# Patient Record
Sex: Male | Born: 1985 | Hispanic: Yes | Marital: Married | Smoking: Never smoker
Health system: Southern US, Community
[De-identification: ages and names within clinical notes are randomized; demographics above are authoritative.]

---

## 2014-11-14 ENCOUNTER — Emergency Department
Admission: EM | Admit: 2014-11-14 | Discharge: 2014-11-15 | Disposition: A | Discharge status: Home / Self Care | Payer: Self-pay | Attending: Emergency Medicine | Admitting: Emergency Medicine

## 2014-11-14 ENCOUNTER — Emergency Department: Payer: Self-pay

## 2014-11-14 ENCOUNTER — Encounter: Payer: Self-pay | Admitting: *Deleted

## 2014-11-14 DIAGNOSIS — N453 Epididymo-orchitis: Secondary | ICD-10-CM | POA: Insufficient documentation

## 2014-11-14 DIAGNOSIS — N50819 Testicular pain, unspecified: Secondary | ICD-10-CM

## 2014-11-14 DIAGNOSIS — R52 Pain, unspecified: Secondary | ICD-10-CM

## 2014-11-14 LAB — URINALYSIS COMPLETE WITH MICROSCOPIC (ARMC ONLY)
BILIRUBIN URINE: NEGATIVE
Bacteria, UA: NONE SEEN
Glucose, UA: NEGATIVE mg/dL
HGB URINE DIPSTICK: NEGATIVE
KETONES UR: NEGATIVE mg/dL
Nitrite: NEGATIVE
PH: 7 (ref 5.0–8.0)
PROTEIN: NEGATIVE mg/dL
Specific Gravity, Urine: 1.018 (ref 1.005–1.030)

## 2014-11-14 MED ORDER — HYDROMORPHONE HCL 1 MG/ML IJ SOLN
0.5000 mg | Freq: Once | INTRAMUSCULAR | Status: AC
  Administered 2014-11-14: 0.5 mg via INTRAMUSCULAR
  Filled 2014-11-14: qty 1

## 2014-11-14 MED ORDER — CEFTRIAXONE SODIUM 250 MG IJ SOLR
250.0000 mg | Freq: Once | INTRAMUSCULAR | Status: AC
  Administered 2014-11-14: 250 mg via INTRAMUSCULAR
  Filled 2014-11-14: qty 250

## 2014-11-14 MED ORDER — DOXYCYCLINE HYCLATE 100 MG PO TABS
100.0000 mg | ORAL_TABLET | Freq: Once | ORAL | Status: AC
  Administered 2014-11-14: 100 mg via ORAL
  Filled 2014-11-14: qty 1

## 2014-11-14 MED ORDER — ONDANSETRON 4 MG PO TBDP
4.0000 mg | ORAL_TABLET | Freq: Once | ORAL | Status: AC
  Administered 2014-11-14: 4 mg via ORAL
  Filled 2014-11-14: qty 1

## 2014-11-14 NOTE — ED Provider Notes (Signed)
[facility] Emergency Department Provider Note  ____________________________________________  Time seen: Approximately 10:56 PM  I have reviewed the triage vital signs and the nursing notes.   HISTORY  Chief Complaint Testicle Pain    HPI Kent Lozano is a 29 y.o. male who complains of right testicular pain and swelling for a week it's getting worse. Patient denies any discharge. Patient says nothing is tries really help very much. The pain is severe and achy in nature.   History reviewed. No pertinent past medical history.  There are no active problems to display for this patient.   History reviewed. No pertinent past surgical history.  Current Outpatient Rx  Name  Route  Sig  Dispense  Refill  . ibuprofen (ADVIL,MOTRIN) 200 MG tablet   Oral   Take 200 mg by mouth every 6 (six) hours as needed.         . doxycycline (VIBRAMYCIN) 50 MG capsule      doxycycline 100 mg 1 po bid Disp 20   20 capsule   0   . oxyCODONE-acetaminophen (PERCOCET) 10-325 MG per tablet   Oral   Take 1 tablet by mouth every 6 (six) hours as needed for pain.   20 tablet   0     Allergies Review of patient's allergies indicates no known allergies.  No family history on file.  Social History Social History  Substance Use Topics  . Smoking status: Never Smoker   . Smokeless tobacco: None  . Alcohol Use: No    Review of Systems Constitutional: No fever/chills Eyes: No visual changes. ENT: No sore throat. Cardiovascular: Denies chest pain. Respiratory: Denies shortness of breath. Gastrointestinal: No abdominal pain.  No nausea, no vomiting.  No diarrhea.  No constipation. Genitourinary: Negative for dysuria. Musculoskeletal: Negative for back pain. Skin: Negative for rash. Neurological: Negative for headaches, focal weakness or numbness.  10-point ROS otherwise negative.  ____________________________________________   PHYSICAL EXAM:  VITAL  SIGNS: ED Triage Vitals  Enc Vitals Group     BP 11/14/14 2134 113/80 mmHg     Pulse Rate 11/14/14 2134 72     Resp 11/14/14 2134 18     Temp 11/14/14 2134 99.9 F (37.7 C)     Temp Source 11/14/14 2134 Oral     SpO2 11/14/14 2134 99 %     Weight 11/14/14 2134 140 lb (63.504 kg)     Height --      Head Cir --      Peak Flow --      Pain Score 11/14/14 2135 10     Pain Loc --      Pain Edu? --      Excl. in GC? --     Constitutional: Alert and oriented. Well appearing and in distress. Eyes: Conjunctivae are normal. PERRL. EOMI. Head: Atraumatic. Nose: No congestion/rhinnorhea. Mouth/Throat: Mucous membranes are moist.  Oropharynx non-erythematous. Neck: No stridor. Cardiovascular: Normal rate, regular rhythm. Grossly normal heart sounds.  Good peripheral circulation. Respiratory: Normal respiratory effort.  No retractions. Lungs CTAB. Gastrointestinal: Soft and nontender. No distention. No abdominal bruits. No CVA tenderness. Genitourinary: Normal male genitalia there is no discharge from the penis. Right testicle is very red swollen and tender. I'm unable to distinguish the epididymis from the rest of testicle. Musculoskeletal: No lower extremity tenderness nor edema.  No joint effusions. Neurologic:  Normal speech and language. No gross focal neurologic deficits are appreciated. No gait instability. Skin:  Skin is warm, dry and  intact. No rash noted. Psychiatric: Mood and affect are normal. Speech and behavior are normal.  ____________________________________________   LABS (all labs ordered are listed, but only abnormal results are displayed)  Labs Reviewed  URINALYSIS COMPLETEWITH MICROSCOPIC ([facility] ONLY) - Abnormal; Notable for the following:    Color, Urine YELLOW (*)    APPearance CLEAR (*)    Leukocytes, UA 2+ (*)    Squamous Epithelial / LPF 0-5 (*)    All other components within normal limits  CHLAMYDIA/NGC RT PCR ([facility] ONLY)    ____________________________________________  EKG   ____________________________________________  RADIOLOGY  No evidence of torsion. Appears to be epididymoorchitis. Ultrasound results discussed with radiology L urologist. He feels that the septations in the hydrocele are okay and would just treat with antibiotics. ____________________________________________   PROCEDURES    ____________________________________________   INITIAL IMPRESSION / ASSESSMENT AND PLAN / ED COURSE  Pertinent labs & imaging results that were available during my care of the patient were reviewed by me and considered in my medical decision making (see chart for details).   ____________________________________________   FINAL CLINICAL IMPRESSION(S) / ED DIAGNOSES  Final diagnoses:  Epididymoorchitis      Arnaldo Natal, MD 11/15/14 [phone]

## 2014-11-14 NOTE — ED Notes (Signed)
Pt ambulatory to ER room 1 from U/S with U/S tech. Pt AAOx4. NAD noted. RR even and nonlabored. Pt c/o right testicle pain x 1 week. Denies abd pain or urinary symptoms. Warm blanket provided to patient. Will continue to monitor.

## 2014-11-14 NOTE — ED Notes (Signed)
Pt states right testicular swelling x 1 week with increased pain. Denies urinary symptoms

## 2014-11-15 LAB — CHLAMYDIA/NGC RT PCR (ARMC ONLY)
CHLAMYDIA TR: NOT DETECTED
N gonorrhoeae: NOT DETECTED

## 2014-11-15 MED ORDER — DOXYCYCLINE HYCLATE 50 MG PO CAPS: ORAL_CAPSULE | ORAL | Status: DC

## 2014-11-15 MED ORDER — OXYCODONE-ACETAMINOPHEN 10-325 MG PO TABS: 1.0000 | ORAL_TABLET | Freq: Four times a day (QID) | ORAL | Status: AC | PRN

## 2018-01-13 ENCOUNTER — Other Ambulatory Visit: Payer: Self-pay

## 2018-01-13 ENCOUNTER — Emergency Department
Admission: EM | Admit: 2018-01-13 | Discharge: 2018-01-13 | Disposition: A | Discharge status: Home / Self Care | Payer: Self-pay | Attending: Emergency Medicine | Admitting: Emergency Medicine

## 2018-01-13 ENCOUNTER — Encounter: Payer: Self-pay | Admitting: Emergency Medicine

## 2018-01-13 ENCOUNTER — Emergency Department: Payer: Self-pay

## 2018-01-13 DIAGNOSIS — W268XXA Contact with other sharp object(s), not elsewhere classified, initial encounter: Secondary | ICD-10-CM | POA: Insufficient documentation

## 2018-01-13 DIAGNOSIS — Y9289 Other specified places as the place of occurrence of the external cause: Secondary | ICD-10-CM | POA: Insufficient documentation

## 2018-01-13 DIAGNOSIS — Y999 Unspecified external cause status: Secondary | ICD-10-CM | POA: Insufficient documentation

## 2018-01-13 DIAGNOSIS — S61511A Laceration without foreign body of right wrist, initial encounter: Secondary | ICD-10-CM | POA: Insufficient documentation

## 2018-01-13 DIAGNOSIS — Y9389 Activity, other specified: Secondary | ICD-10-CM | POA: Insufficient documentation

## 2018-01-13 MED ORDER — LIDOCAINE HCL (PF) 1 % IJ SOLN
5.0000 mL | Freq: Once | INTRAMUSCULAR | Status: AC
  Administered 2018-01-13: 5 mL
  Filled 2018-01-13: qty 5

## 2018-01-13 MED ORDER — BACITRACIN-NEOMYCIN-POLYMYXIN 400-5-5000 EX OINT
TOPICAL_OINTMENT | Freq: Once | CUTANEOUS | Status: AC
  Administered 2018-01-13: 22:00:00 via TOPICAL
  Filled 2018-01-13: qty 1

## 2018-01-13 NOTE — ED Triage Notes (Addendum)
Patient ambulatory to triage with steady gait, without difficulty or distress noted; pt reports was cutting a duct and fell cutting right wrist with piece of metal; denies need to file workers comp; no active bleeding; gauze dressing applied

## 2018-01-13 NOTE — ED Provider Notes (Signed)
[facility] Emergency Department Provider Note  ____________________________________________   First MD Initiated Contact with Patient 01/13/18 2044     (approximate)  I have reviewed the triage vital signs and the nursing notes.   HISTORY  Chief Complaint Laceration    HPI Kent Lozano is a 32 y.o. male presents emergency department complaining of right wrist pain.  He states he was cutting air duct and fell cutting his right wrist.  He denies any numbness or tingling.  He states he has full motion of his hand.  He is tetanus is up-to-date.  He is afraid that the metal hit the bone.   History reviewed. No pertinent past medical history.  There are no active problems to display for this patient.   History reviewed. No pertinent surgical history.  Prior to Admission medications   Not on File    Allergies Patient has no known allergies.  No family history on file.  Social History Social History   Tobacco Use  . Smoking status: Never Smoker  . Smokeless tobacco: Never Used  Substance Use Topics  . Alcohol use: No  . Drug use: Not on file    Review of Systems  Constitutional: No fever/chills Eyes: No visual changes. ENT: No sore throat. Respiratory: Denies cough Genitourinary: Negative for dysuria. Musculoskeletal: Negative for back pain.  Positive for laceration to the right wrist and wrist pain. Skin: Negative for rash.    ____________________________________________   PHYSICAL EXAM:  VITAL SIGNS: ED Triage Vitals [01/13/18 2034]  Enc Vitals Group     BP 118/75     Pulse Rate 63     Resp 18     Temp 98.3 F (36.8 C)     Temp Source Oral     SpO2 99 %     Weight 135 lb (61.2 kg)     Height 5\' 6"  (1.676 m)     Head Circumference      Peak Flow      Pain Score 9     Pain Loc      Pain Edu?      Excl. in GC?     Constitutional: Alert and oriented. Well appearing and in no acute distress. Eyes: Conjunctivae are  normal.  Head: Atraumatic. Nose: No congestion/rhinnorhea. Mouth/Throat: Mucous membranes are moist.   Neck:  supple no lymphadenopathy noted Cardiovascular: Normal rate, regular rhythm.  Respiratory: Normal respiratory effort.  No retractions, l GU: deferred Musculoskeletal: FROM all extremities, warm and well perfused.  Full range of motion of all fingers.  Patient is able to make a fist without difficulty.  Laceration along the ulnar aspect of the dorsum of the right wrist.  No tendon laceration is noted.  No foreign body is noted. Neurologic:  Normal speech and language.  Skin:  Skin is warm, dry . No rash noted.  Positive laceration to the right wrist Psychiatric: Mood and affect are normal. Speech and behavior are normal.  ____________________________________________   LABS (all labs ordered are listed, but only abnormal results are displayed)  Labs Reviewed - No data to display ____________________________________________   ____________________________________________  RADIOLOGY  X-ray of the right wrist is negative for fracture or foreign body.  ____________________________________________   PROCEDURES  Procedure(s) performed: No  ..Laceration Repair Date/Time: 01/13/2018 10:11 PM Performed by: Faythe Ghee, PA-C Authorized by: Faythe Ghee, PA-C   Consent:    Consent obtained:  Verbal   Consent given by:  Patient   Risks  discussed:  Infection, pain, retained foreign body, tendon damage, poor wound healing and nerve damage   Alternatives discussed:  Delayed treatment Anesthesia (see MAR for exact dosages):    Anesthesia method:  Local infiltration   Local anesthetic:  Lidocaine 1% w/o epi Laceration details:    Location:  Shoulder/arm   Shoulder/arm location:  R lower arm   Length (cm):  2   Depth (mm):  3 Repair type:    Repair type:  Simple Pre-procedure details:    Preparation:  Patient was prepped and draped in usual sterile  fashion Exploration:    Hemostasis achieved with:  Direct pressure   Wound exploration: wound explored through full range of motion     Wound extent: no foreign bodies/material noted, no nerve damage noted, no tendon damage noted and no underlying fracture noted   Treatment:    Area cleansed with:  Betadine and saline   Amount of cleaning:  Standard   Irrigation solution:  Sterile saline   Irrigation method:  Syringe and tap Skin repair:    Repair method:  Sutures   Suture size:  5-0   Suture material:  Nylon   Suture technique:  Simple interrupted   Number of sutures:  7 Approximation:    Approximation:  Close Post-procedure details:    Dressing:  Antibiotic ointment, non-adherent dressing and splint for protection   Patient tolerance of procedure:  Tolerated well, no immediate complications      ____________________________________________   INITIAL IMPRESSION / ASSESSMENT AND PLAN / ED COURSE  Pertinent labs & imaging results that were available during my care of the patient were reviewed by me and considered in my medical decision making (see chart for details).   Patient is 32 year old male presents emergency department laceration to the right wrist.  Tetanus is up-to-date.  On physical exam patient appears well.  There is a 2 cm laceration to the right wrist.  X-ray of the right wrist is negative.  The area was repaired with 7 simple sutures.  See procedure note.  The patient was given instructions on how to care for the sutures.  Sutures should be removed in 7 to 10 days.  Take Tylenol or ibuprofen for pain as needed.  Wear the splint especially while at work to not rip out the sutures.  The patient states he understands will comply.  He was given a work note for tomorrow if needed.  He was discharged in stable condition in the care of his family.     As part of my medical decision making, I reviewed the following data within the electronic MEDICAL RECORD NUMBER Nursing  notes reviewed and incorporated, Old chart reviewed, Radiograph reviewed x-ray of the right wrist is negative for fracture, Notes from prior ED visits and [place] Controlled Substance Database  ____________________________________________   FINAL CLINICAL IMPRESSION(S) / ED DIAGNOSES  Final diagnoses:  Wrist laceration, right, initial encounter      NEW MEDICATIONS STARTED DURING THIS VISIT:  Discharge Medication List as of 01/13/2018 10:02 PM       Note:  This document was prepared using Dragon voice recognition software and may include unintentional dictation errors.    Faythe Ghee, PA-C 01/13/18 2213    Dionne Bucy, MD 01/14/18 [phone]

## 2018-01-13 NOTE — Discharge Instructions (Addendum)
Clean the area with soap and water daily.  When you are at home and the area is not draining please allow the air to get to the wound so it will heal easier.  Wear the wrist splint when it works you do not refound stitches.  Return to the emergency department there is any sign of infection.  Sutures should be removed in 7 to 10 days.

## 2020-02-01 IMAGING — DX DG WRIST COMPLETE 3+V*R*
4 series · 4 of 4 positions shown · non-contrast
Comparison: None.

CLINICAL DATA: Pain and injury

EXAM:
RIGHT WRIST - COMPLETE 3+ VIEW

[wrist ap (1 of 2)]
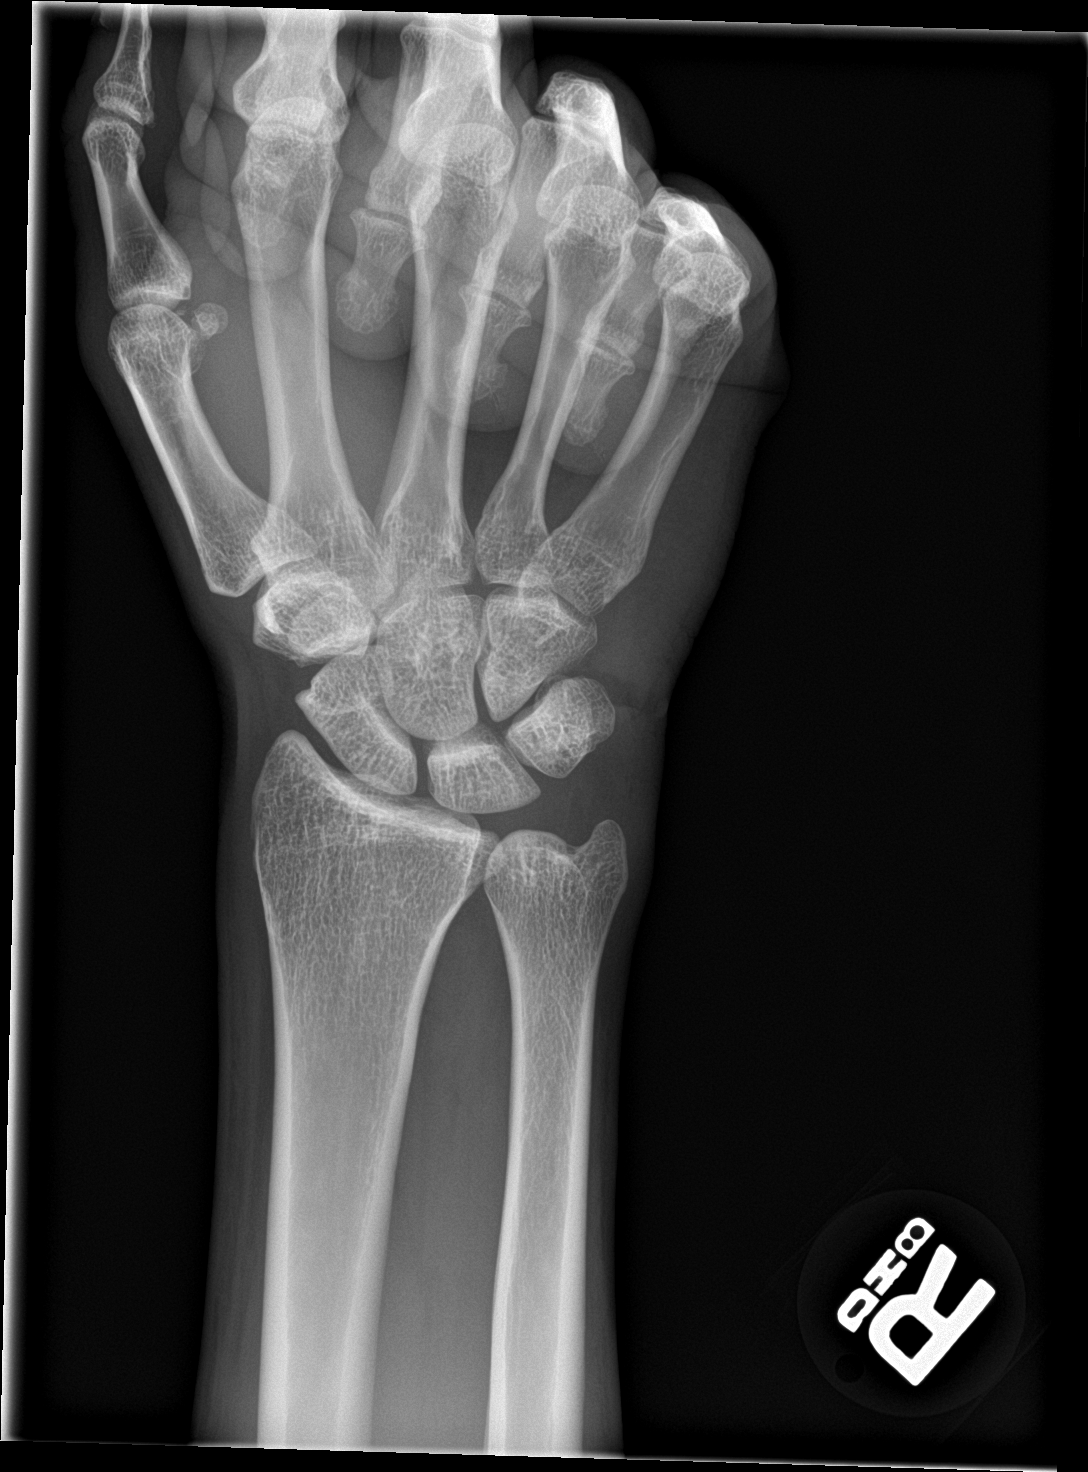

[wrist obl]
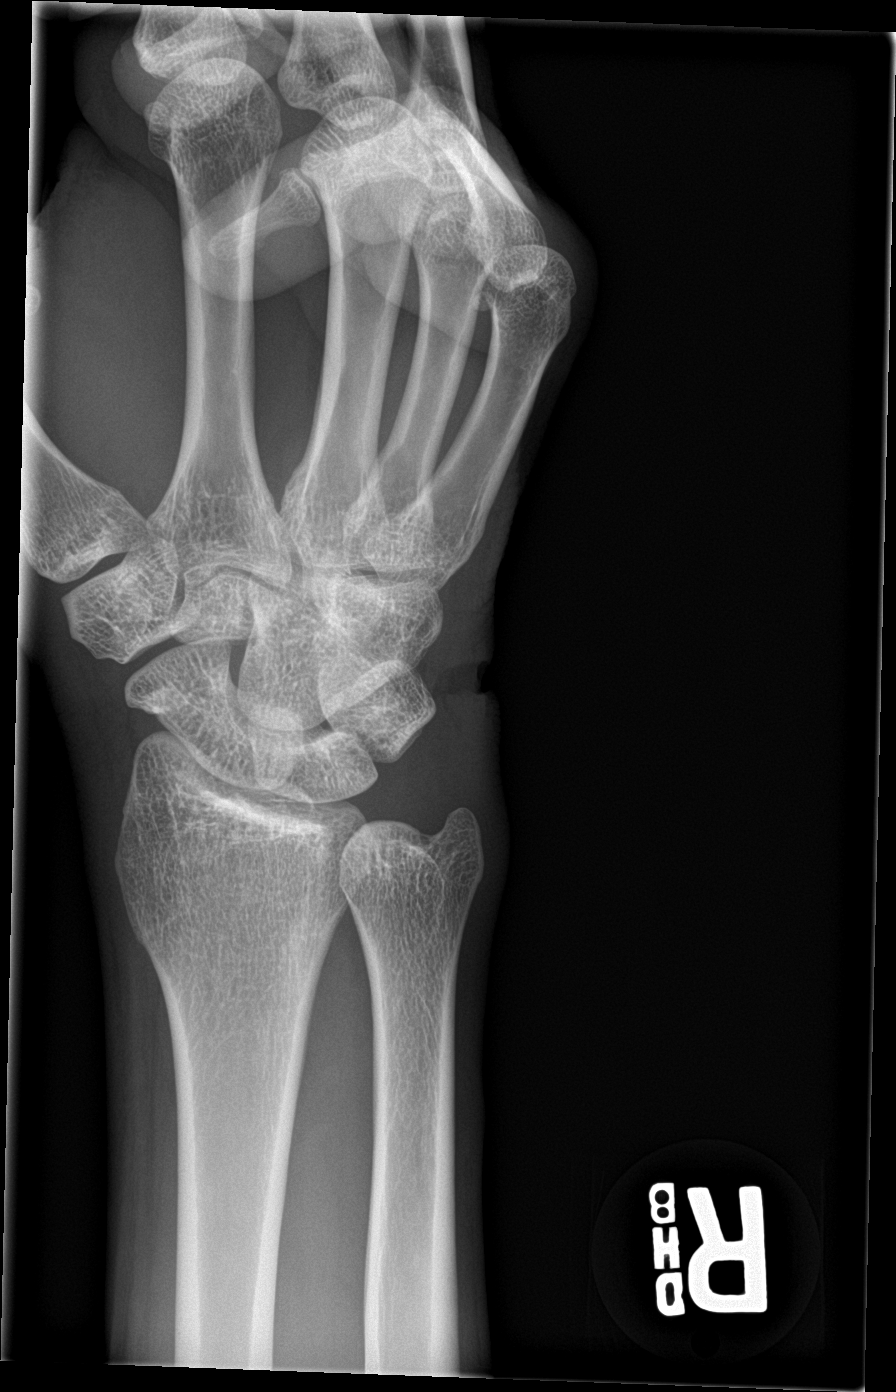

[wrist lat]
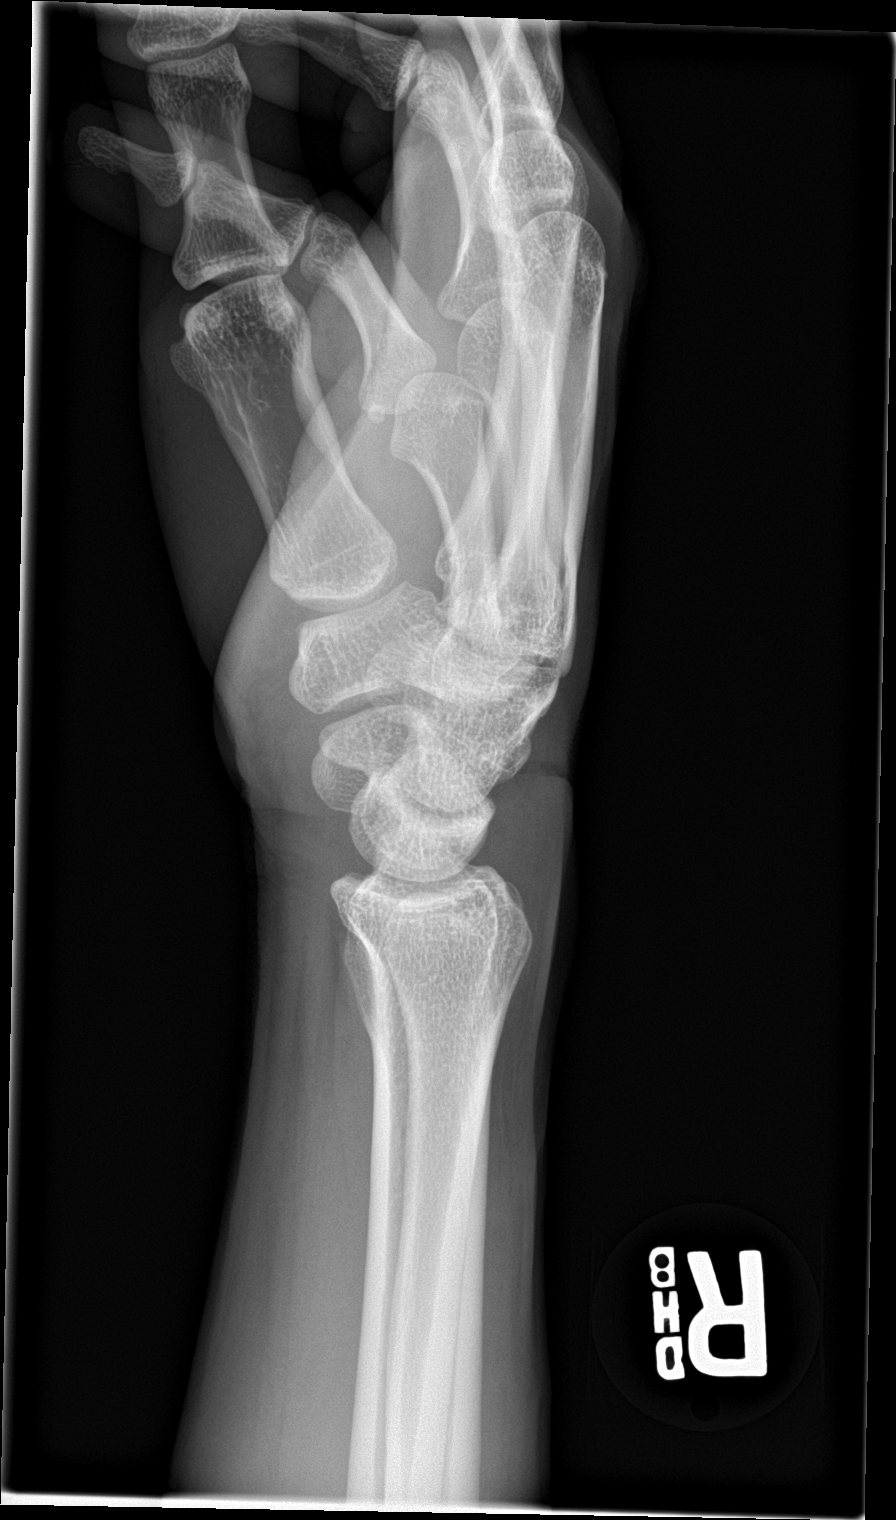

[wrist ap (2 of 2)]
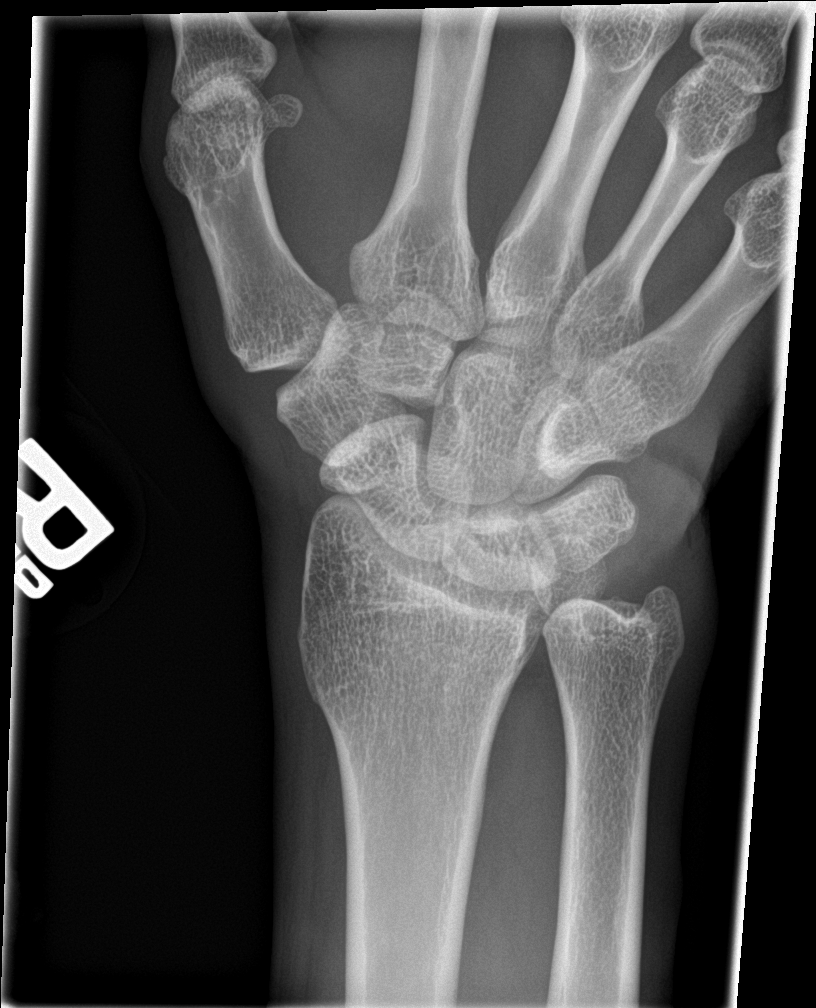

[4 of 4 positions shown; findings below may reference images not displayed]

FINDINGS: No fracture or malalignment. Laceration dorsal, ulnar aspect of the
wrist. No radiopaque foreign body.
IMPRESSION: No acute osseous abnormality.
